# Patient Record
Sex: Female | Born: 1990 | Race: White | Hispanic: No | Marital: Married
Health system: Southern US, Community
[De-identification: ages and names within clinical notes are randomized; demographics above are authoritative.]

---
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
## 2011-04-01 ENCOUNTER — Ambulatory Visit: Payer: Self-pay | Admitting: Family Medicine

## 2012-11-14 IMAGING — CR DG HIP COMPLETE 2+V*L*
1 series · 4 of 4 positions shown · non-contrast
Comparison: none

REASON FOR EXAM: left hip pain
COMMENTS:

PROCEDURE:     KDR - KDXR HIP LEFT COMPLETE  - April 01, 2011  [DATE]
RESULT:     No fracture, dislocation or other acute bony abnormality is
identified. The hip joint space is well maintained. No lytic or blastic
lesions are seen.

[Series 1: view not recorded · 0.17mm/px · 4 of 4 slices shown]
[im 1/4]
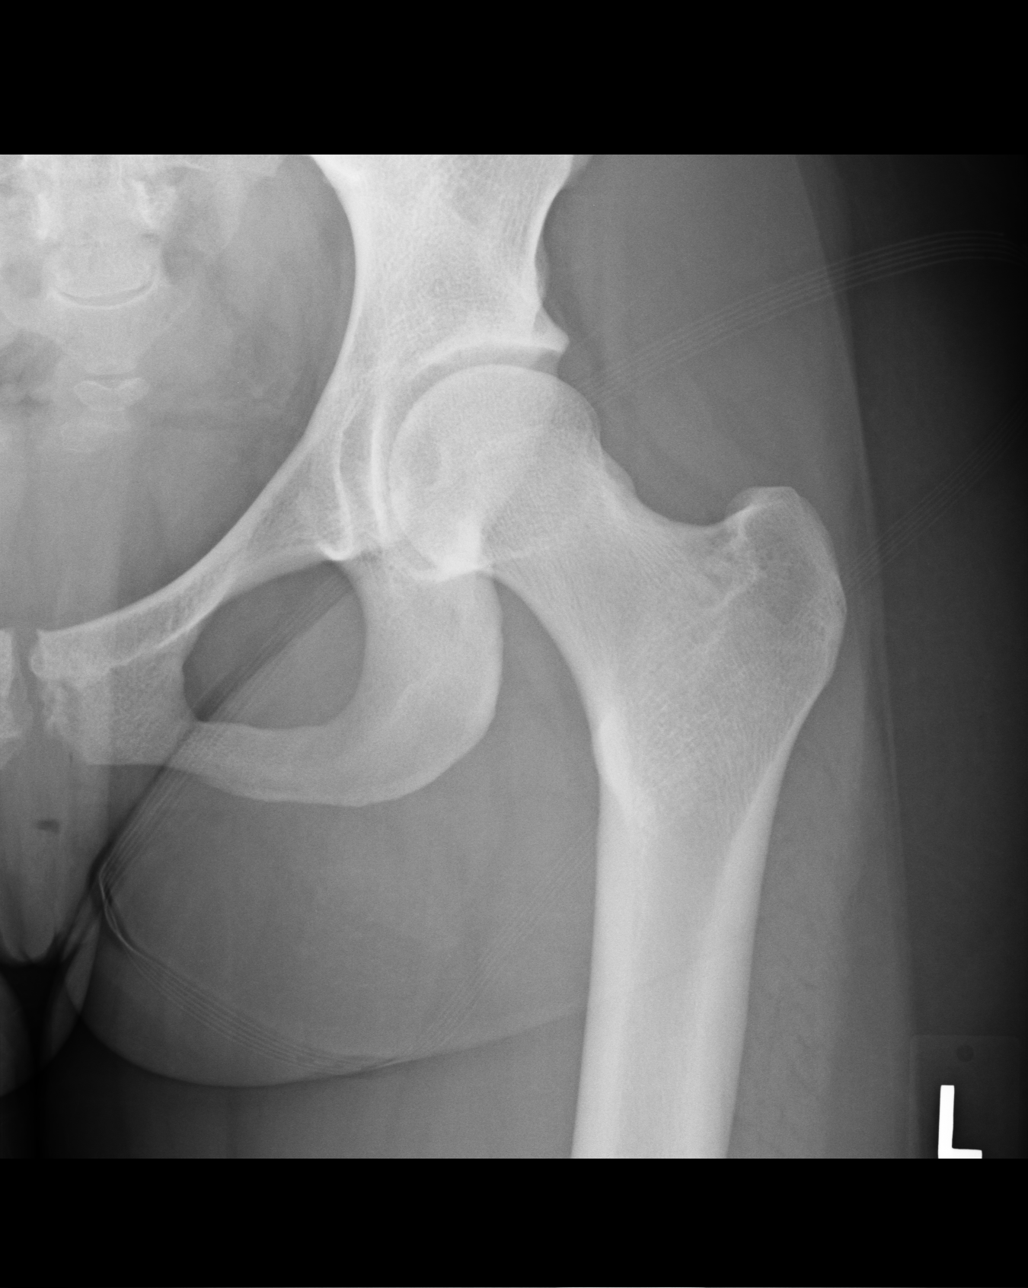
[im 2/4]
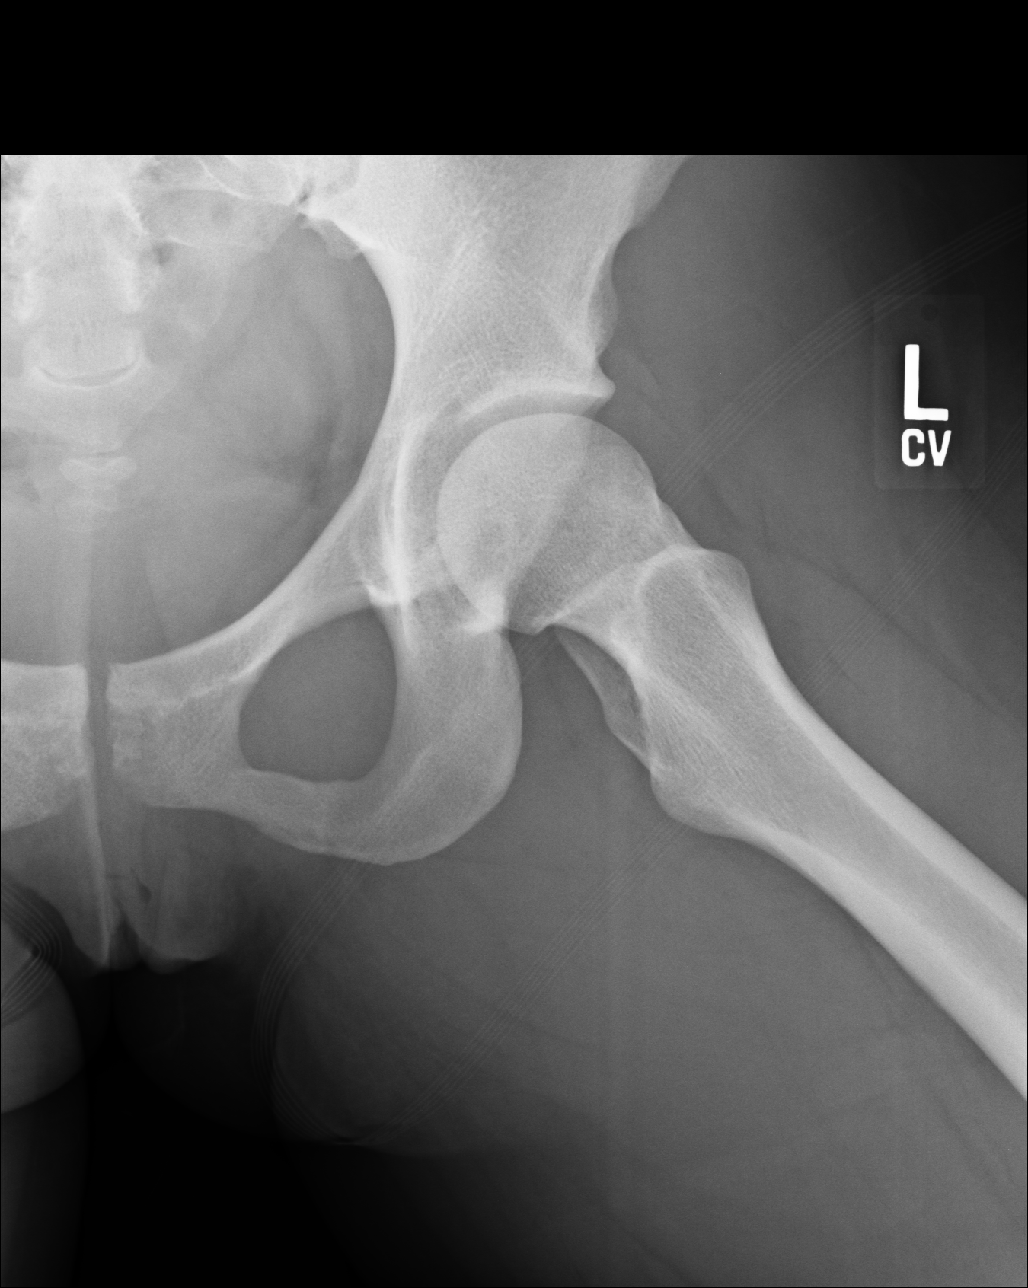
[im 3/4]
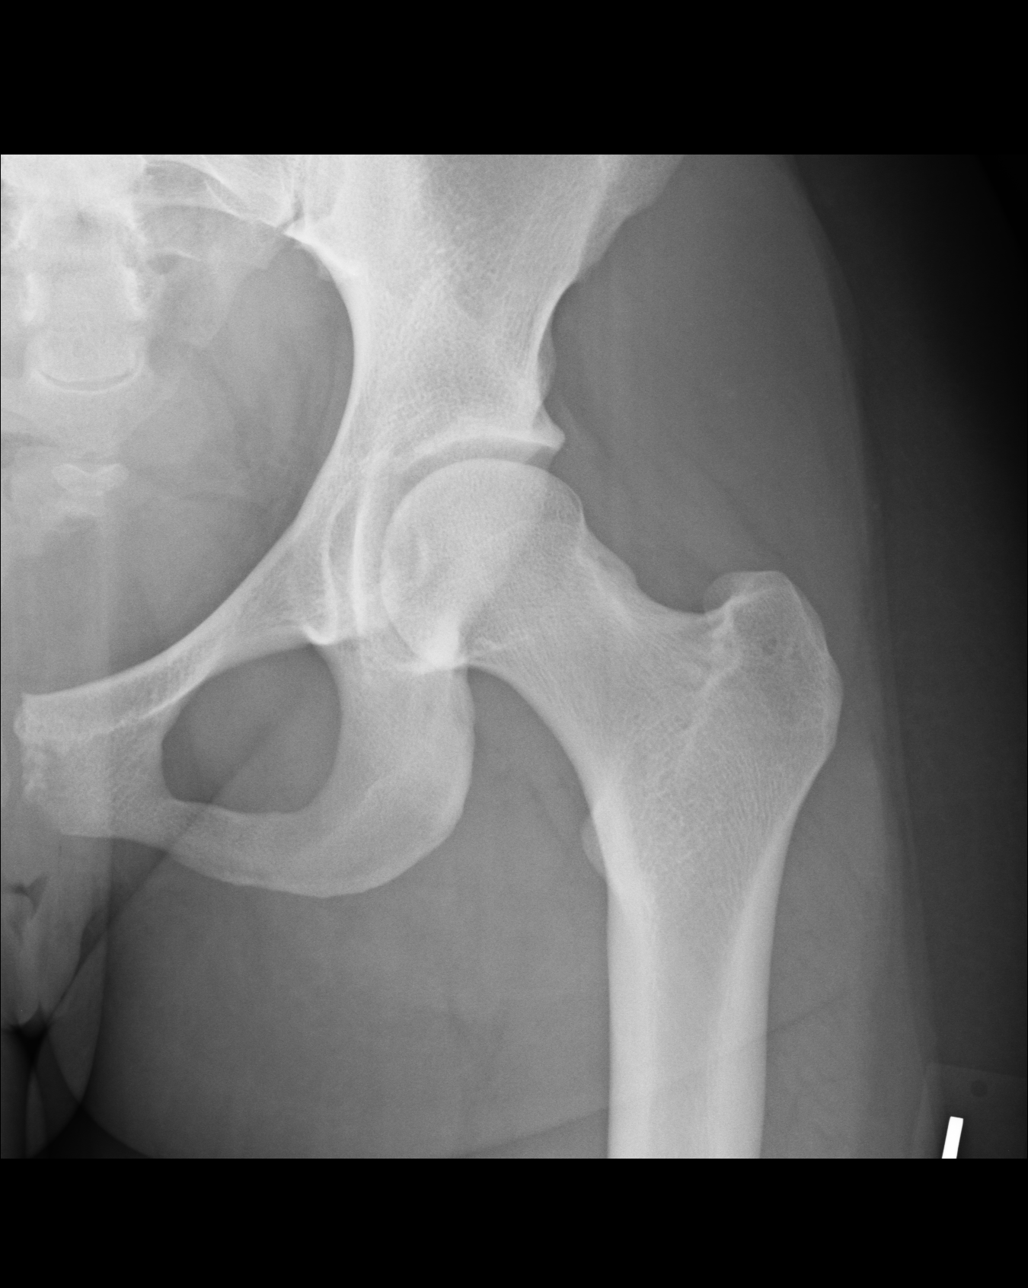
[im 4/4]
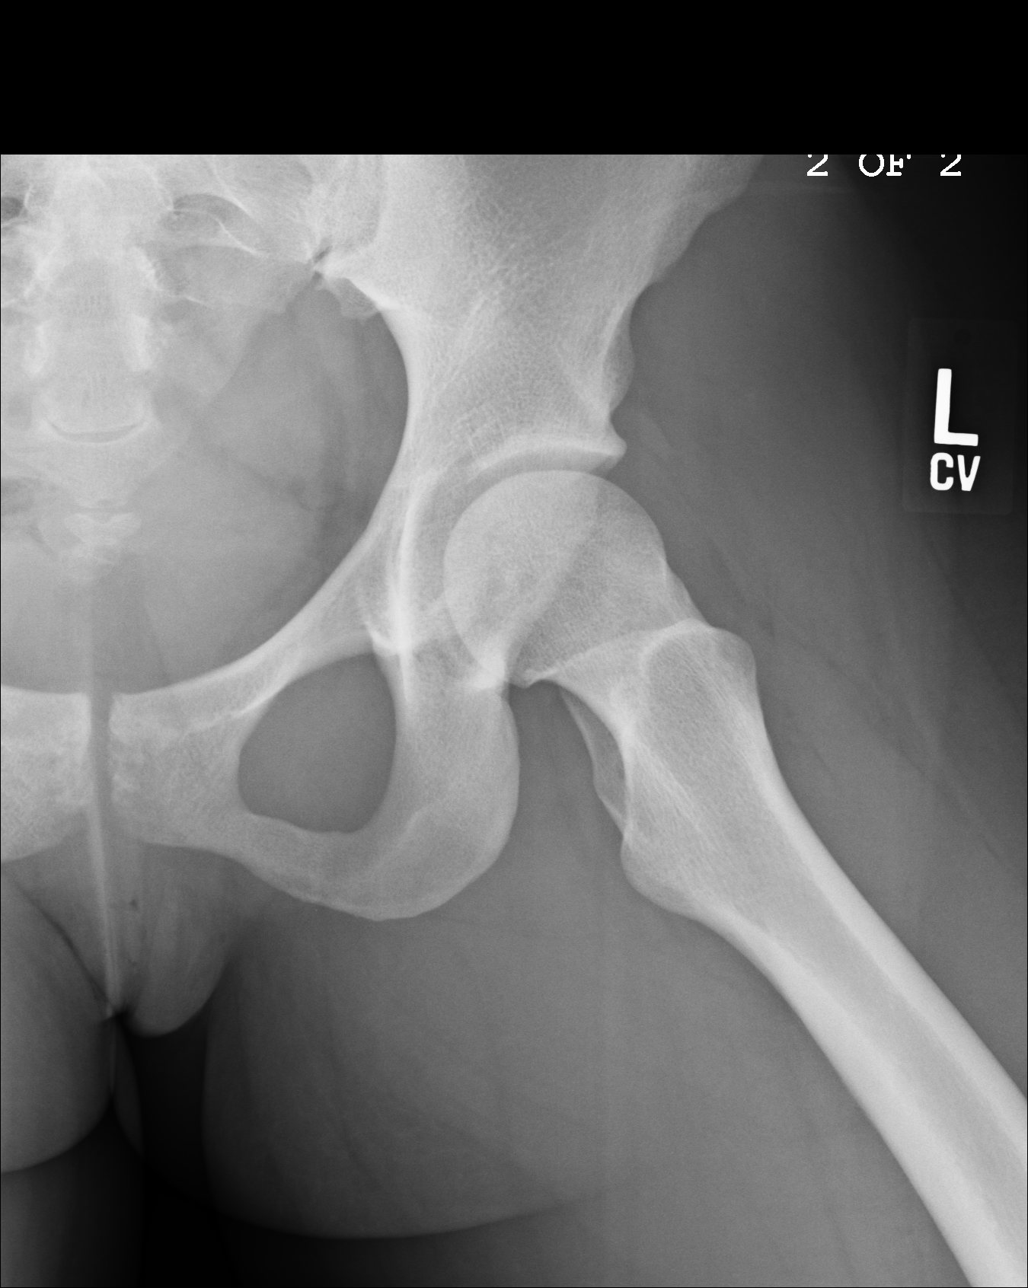

[4 of 4 positions shown; findings below may reference images not displayed]

IMPRESSION: No acute bony abnormalities are identified.

## 2013-10-09 ENCOUNTER — Ambulatory Visit: Payer: Self-pay | Admitting: Physician Assistant

## 2013-10-09 LAB — URINALYSIS, COMPLETE
BLOOD: NEGATIVE
Glucose,UR: NEGATIVE mg/dL (ref 0–75)
Ketone: NEGATIVE
Nitrite: NEGATIVE
Ph: 7.5 (ref 4.5–8.0)
Protein: NEGATIVE
Specific Gravity: 1.01 (ref 1.003–1.030)

## 2013-10-09 LAB — PREGNANCY, URINE: PREGNANCY TEST, URINE: NEGATIVE m[IU]/mL

## 2013-12-31 ENCOUNTER — Emergency Department: Payer: Self-pay | Admitting: Emergency Medicine

## 2013-12-31 ENCOUNTER — Ambulatory Visit: Payer: Self-pay | Admitting: Family Medicine

## 2022-09-26 ENCOUNTER — Emergency Department (HOSPITAL_BASED_OUTPATIENT_CLINIC_OR_DEPARTMENT_OTHER)
Admission: EM | Admit: 2022-09-26 | Discharge: 2022-09-26 | Disposition: A | Discharge status: Home / Self Care | Payer: BC Managed Care – PPO | Attending: Emergency Medicine | Admitting: Emergency Medicine

## 2022-09-26 ENCOUNTER — Encounter (HOSPITAL_BASED_OUTPATIENT_CLINIC_OR_DEPARTMENT_OTHER): Payer: Self-pay | Admitting: Emergency Medicine

## 2022-09-26 ENCOUNTER — Other Ambulatory Visit: Payer: Self-pay

## 2022-09-26 ENCOUNTER — Other Ambulatory Visit (HOSPITAL_BASED_OUTPATIENT_CLINIC_OR_DEPARTMENT_OTHER): Payer: Self-pay

## 2022-09-26 DIAGNOSIS — R197 Diarrhea, unspecified: Secondary | ICD-10-CM | POA: Diagnosis not present

## 2022-09-26 DIAGNOSIS — Z1152 Encounter for screening for COVID-19: Secondary | ICD-10-CM | POA: Diagnosis not present

## 2022-09-26 DIAGNOSIS — R6883 Chills (without fever): Secondary | ICD-10-CM | POA: Diagnosis not present

## 2022-09-26 DIAGNOSIS — R112 Nausea with vomiting, unspecified: Secondary | ICD-10-CM | POA: Diagnosis present

## 2022-09-26 LAB — RESP PANEL BY RT-PCR (RSV, FLU A&B, COVID)  RVPGX2
Influenza A by PCR: NEGATIVE
Influenza B by PCR: NEGATIVE
Resp Syncytial Virus by PCR: NEGATIVE
SARS Coronavirus 2 by RT PCR: NEGATIVE

## 2022-09-26 LAB — COMPREHENSIVE METABOLIC PANEL
ALT: 29 U/L (ref 0–44)
AST: 14 U/L — ABNORMAL LOW (ref 15–41)
Albumin: 3.9 g/dL (ref 3.5–5.0)
Alkaline Phosphatase: 50 U/L (ref 38–126)
Anion gap: 11 (ref 5–15)
BUN: 12 mg/dL (ref 6–20)
CO2: 23 mmol/L (ref 22–32)
Calcium: 8.9 mg/dL (ref 8.9–10.3)
Chloride: 104 mmol/L (ref 98–111)
Creatinine, Ser: 0.85 mg/dL (ref 0.44–1.00)
GFR, Estimated: >60 mL/min (ref >60)
Glucose, Bld: 121 mg/dL — ABNORMAL HIGH (ref 70–99)
Potassium: 3.5 mmol/L (ref 3.5–5.1)
Sodium: 138 mmol/L (ref 135–145)
Total Bilirubin: 0.9 mg/dL (ref 0.3–1.2)
Total Protein: 6.8 g/dL (ref 6.5–8.1)

## 2022-09-26 LAB — CBC WITH DIFFERENTIAL/PLATELET
Abs Immature Granulocytes: 0.03 10*3/uL (ref 0.00–0.07)
Basophils Absolute: 0 10*3/uL (ref 0.0–0.1)
Basophils Relative: 0 %
Eosinophils Absolute: 0 10*3/uL (ref 0.0–0.5)
Eosinophils Relative: 0 %
HCT: 37.1 % (ref 36.0–46.0)
Hemoglobin: 12.3 g/dL (ref 12.0–15.0)
Immature Granulocytes: 0 %
Lymphocytes Relative: 7 %
Lymphs Abs: 0.9 10*3/uL (ref 0.7–4.0)
MCH: 29.8 pg (ref 26.0–34.0)
MCHC: 33.2 g/dL (ref 30.0–36.0)
MCV: 89.8 fL (ref 80.0–100.0)
Monocytes Absolute: 0.6 10*3/uL (ref 0.1–1.0)
Monocytes Relative: 4 %
Neutro Abs: 12 10*3/uL — ABNORMAL HIGH (ref 1.7–7.7)
Neutrophils Relative %: 89 %
Platelets: 189 10*3/uL (ref 150–400)
RBC: 4.13 MIL/uL (ref 3.87–5.11)
RDW: 13.2 % (ref 11.5–15.5)
WBC: 13.5 10*3/uL — ABNORMAL HIGH (ref 4.0–10.5)
nRBC: 0 % (ref 0.0–0.2)

## 2022-09-26 LAB — URINALYSIS, ROUTINE W REFLEX MICROSCOPIC
Bilirubin Urine: NEGATIVE
Glucose, UA: NEGATIVE mg/dL
Hgb urine dipstick: NEGATIVE
Ketones, ur: NEGATIVE mg/dL
Leukocytes,Ua: NEGATIVE
Nitrite: NEGATIVE
Protein, ur: NEGATIVE mg/dL
Specific Gravity, Urine: 1.015 (ref 1.005–1.030)
pH: 7 (ref 5.0–8.0)

## 2022-09-26 LAB — LIPASE, BLOOD: Lipase: 12 U/L (ref 11–51)

## 2022-09-26 LAB — PREGNANCY, URINE: Preg Test, Ur: NEGATIVE

## 2022-09-26 MED ORDER — PROCHLORPERAZINE EDISYLATE 10 MG/2ML IJ SOLN
5.0000 mg | Freq: Once | INTRAMUSCULAR | Status: AC
  Administered 2022-09-26: 5 mg via INTRAVENOUS
  Filled 2022-09-26: qty 2

## 2022-09-26 MED ORDER — SODIUM CHLORIDE 0.9 % IV SOLN
12.5000 mg | Freq: Once | INTRAVENOUS | Status: AC
  Administered 2022-09-26: 12.5 mg via INTRAVENOUS
  Filled 2022-09-26: qty 0.5

## 2022-09-26 MED ORDER — LACTATED RINGERS IV BOLUS
1000.0000 mL | Freq: Once | INTRAVENOUS | Status: AC
  Administered 2022-09-26: 1000 mL via INTRAVENOUS

## 2022-09-26 MED ORDER — ONDANSETRON 4 MG PO TBDP
4.0000 mg | ORAL_TABLET | Freq: Three times a day (TID) | ORAL | 0 refills | Status: AC | PRN
  Filled 2022-09-26: qty 20, 7d supply, fill #0

## 2022-09-26 MED ORDER — PROMETHAZINE HCL 25 MG/ML IJ SOLN
INTRAMUSCULAR | Status: AC
  Filled 2022-09-26: qty 1

## 2022-09-26 MED ORDER — DIPHENHYDRAMINE HCL 50 MG/ML IJ SOLN
12.5000 mg | Freq: Once | INTRAMUSCULAR | Status: AC
  Administered 2022-09-26: 12.5 mg via INTRAVENOUS
  Filled 2022-09-26: qty 1

## 2022-09-26 MED ORDER — PROMETHAZINE HCL 25 MG PO TABS
25.0000 mg | ORAL_TABLET | Freq: Four times a day (QID) | ORAL | 0 refills | Status: AC | PRN
  Filled 2022-09-26: qty 20, 5d supply, fill #0

## 2022-09-26 MED ORDER — ONDANSETRON HCL 4 MG/2ML IJ SOLN
4.0000 mg | Freq: Once | INTRAMUSCULAR | Status: AC
  Administered 2022-09-26: 4 mg via INTRAVENOUS
  Filled 2022-09-26: qty 2

## 2022-09-26 NOTE — ED Provider Notes (Signed)
[facility] Provider Note   CSN: [id number] Arrival date & time: 09/26/22  0645     History  Chief Complaint  Patient presents with   Nausea   Emesis   Diarrhea    Cynthia Pierce is a 32 y.o. female.  Patient here with nausea vomiting diarrhea that started overnight.  About 10-12 episodes.  No sick contacts.  No suspicious food intake.  No abdominal pain.  Denies any chest pain or weakness.  She has had some chills.  Nothing makes it worse or better.  No major abdominal surgery history.  The history is provided by the patient.       Home Medications Prior to Admission medications   Medication Sig Start Date End Date Taking? Authorizing Provider  ondansetron (ZOFRAN-ODT) 4 MG disintegrating tablet Take 1 tablet (4 mg total) by mouth every 8 (eight) hours as needed for up to 20 doses for nausea or vomiting. 09/26/22  Yes Dashonna Chagnon, DO  promethazine (PHENERGAN) 25 MG tablet Take 1 tablet (25 mg total) by mouth every 6 (six) hours as needed for up to 20 doses for nausea or vomiting. 09/26/22  Yes Rachael Ferrie, DO      Allergies    Patient has no known allergies.    Review of Systems   Review of Systems  Physical Exam Updated Vital Signs BP 131/76   Pulse 76   Temp 98.1 F (36.7 C) (Oral)   Resp 16   Ht 5' 8"$  (1.727 m)   Wt 97.5 kg   LMP 09/22/2022   SpO2 100%   BMI 32.69 kg/m  Physical Exam Vitals and nursing note reviewed.  Constitutional:      General: She is not in acute distress.    Appearance: She is well-developed. She is not ill-appearing.  HENT:     Head: Normocephalic and atraumatic.     Nose: Nose normal.     Mouth/Throat:     Mouth: Mucous membranes are moist.  Eyes:     Extraocular Movements: Extraocular movements intact.     Conjunctiva/sclera: Conjunctivae normal.     Pupils: Pupils are equal, round, and reactive to light.  Cardiovascular:     Rate and Rhythm: Normal rate and regular rhythm.      Pulses: Normal pulses.     Heart sounds: No murmur heard. Pulmonary:     Effort: Pulmonary effort is normal. No respiratory distress.     Breath sounds: Normal breath sounds.  Abdominal:     Palpations: Abdomen is soft.     Tenderness: There is no abdominal tenderness.  Musculoskeletal:        General: No swelling.     Cervical back: Normal range of motion and neck supple.  Skin:    General: Skin is warm and dry.     Capillary Refill: Capillary refill takes less than 2 seconds.  Neurological:     General: No focal deficit present.     Mental Status: She is alert.  Psychiatric:        Mood and Affect: Mood normal.     ED Results / Procedures / Treatments   Labs (all labs ordered are listed, but only abnormal results are displayed) Labs Reviewed  CBC WITH DIFFERENTIAL/PLATELET - Abnormal; Notable for the following components:      Result Value   WBC 13.5 (*)    Neutro Abs 12.0 (*)    All other components within normal limits  COMPREHENSIVE METABOLIC  PANEL - Abnormal; Notable for the following components:   Glucose, Bld 121 (*)    AST 14 (*)    All other components within normal limits  URINALYSIS, ROUTINE W REFLEX MICROSCOPIC - Abnormal; Notable for the following components:   Color, Urine COLORLESS (*)    All other components within normal limits  RESP PANEL BY RT-PCR (RSV, FLU A&B, COVID)  [id number]  LIPASE, BLOOD  PREGNANCY, URINE    EKG None  Radiology No results found.  Procedures Procedures    Medications Ordered in ED Medications  promethazine (PHENERGAN) 25 MG/ML injection (0 mg  Hold 09/26/22 0900)  lactated ringers bolus 1,000 mL (0 mLs Intravenous Stopped 09/26/22 0824)  ondansetron (ZOFRAN) injection 4 mg (4 mg Intravenous Given 09/26/22 0716)  lactated ringers bolus 1,000 mL (1,000 mLs Intravenous New Bag/Given 09/26/22 0820)  prochlorperazine (COMPAZINE) injection 5 mg (5 mg Intravenous Given 09/26/22 0758)  diphenhydrAMINE (BENADRYL) injection 12.5 mg (12.5  mg Intravenous Given 09/26/22 0758)  promethazine (PHENERGAN) 12.5 mg in sodium chloride 0.9 % 50 mL IVPB ( Intravenous Stopped 09/26/22 0915)    ED Course/ Medical Decision Making/ A&P                             Medical Decision Making Risk Prescription drug management.   Cynthia Pierce is here with nausea vomiting diarrhea.  Normal vitals.  No fever.  Overall she appears well may be mildly dehydrated.  Differential diagnosis includes viral gastroenteritis versus foodborne illness.  She has no abdominal tenderness and doubt appendicitis or other acute intra-abdominal process.  Will get CBC, CMP, lipase, urinalysis and give IV fluids and IV Zofran and check COVID and flu test.  Will reevaluate abdomen.  Per my review interpretation of labs is no significant anemia or electrolyte abnormality or kidney injury.  Urinalysis negative for infection.  Negative pregnancy test.  She is feeling better after IV fluids, IV antiemetics.  Still having some emesis and loose stools while in the ED but she prefers to be discharged home and try antiemetics at home.  Overall she is well-appearing.  She does not have any abdominal tenderness on exam.  My suspicion is that this is a foodborne illness or viral illness.  She understands return precautions.  Discharged in good condition.  This chart was dictated using voice recognition software.  Despite best efforts to proofread,  errors can occur which can change the documentation meaning.         Final Clinical Impression(s) / ED Diagnoses Final diagnoses:  Nausea vomiting and diarrhea    Rx / DC Orders ED Discharge Orders          Ordered    ondansetron (ZOFRAN-ODT) 4 MG disintegrating tablet  Every 8 hours PRN        09/26/22 0932    promethazine (PHENERGAN) 25 MG tablet  Every 6 hours PRN        09/26/22 0932              Lennice Sites, DO 09/26/22 [id number]

## 2022-09-26 NOTE — ED Notes (Signed)
Pt continues to c/o emesis, although pt looks improved.

## 2022-09-26 NOTE — ED Notes (Signed)
Pt reports improvement in symptoms after receiving compazine and benadryl

## 2022-09-26 NOTE — ED Triage Notes (Signed)
  Patient BIB EMS for N/V/D that started around 0130 this morning.  Patient estimates about 10-12 episodes of emesis and uncontrollable diarrhea.  No sick contacts that she is aware of.  No complaints of pain at this time but feels weak.

## 2022-09-26 NOTE — ED Notes (Signed)
Attempted to obtain blood x 2 unsuccessfully

## 2022-11-03 ENCOUNTER — Other Ambulatory Visit: Payer: Self-pay | Admitting: Rehabilitation

## 2022-11-03 DIAGNOSIS — M5416 Radiculopathy, lumbar region: Secondary | ICD-10-CM

## 2022-11-13 ENCOUNTER — Encounter: Payer: Self-pay | Admitting: Rehabilitation

## 2022-11-22 ENCOUNTER — Ambulatory Visit
Admission: RE | Admit: 2022-11-22 | Discharge: 2022-11-22 | Disposition: A | Discharge status: Home / Self Care | Payer: BC Managed Care – PPO | Source: Ambulatory Visit | Attending: Rehabilitation | Admitting: Rehabilitation

## 2022-11-22 DIAGNOSIS — M5416 Radiculopathy, lumbar region: Secondary | ICD-10-CM
